# Patient Record
Sex: Male | Born: 1990 | Hispanic: Yes | Marital: Single | Smoking: Never smoker
Health system: Southern US, Community
[De-identification: ages and names within clinical notes are randomized; demographics above are authoritative.]

## PROBLEM LIST (undated history)

## (undated) ENCOUNTER — Ambulatory Visit (HOSPITAL_COMMUNITY): Admission: EM | Payer: Self-pay

## (undated) DIAGNOSIS — I10 Essential (primary) hypertension: Secondary | ICD-10-CM

---

## 2011-10-13 ENCOUNTER — Emergency Department (HOSPITAL_COMMUNITY)
Admission: EM | Admit: 2011-10-13 | Discharge: 2011-10-14 | Disposition: A | Discharge status: Home / Self Care | Payer: No Typology Code available for payment source | Attending: Emergency Medicine | Admitting: Emergency Medicine

## 2011-10-13 DIAGNOSIS — S0003XA Contusion of scalp, initial encounter: Secondary | ICD-10-CM | POA: Insufficient documentation

## 2011-10-13 DIAGNOSIS — S1093XA Contusion of unspecified part of neck, initial encounter: Secondary | ICD-10-CM | POA: Insufficient documentation

## 2011-10-13 DIAGNOSIS — R51 Headache: Secondary | ICD-10-CM | POA: Insufficient documentation

## 2011-10-13 DIAGNOSIS — S0990XA Unspecified injury of head, initial encounter: Secondary | ICD-10-CM | POA: Insufficient documentation

## 2011-10-14 ENCOUNTER — Emergency Department (HOSPITAL_COMMUNITY): Payer: No Typology Code available for payment source

## 2012-09-20 ENCOUNTER — Ambulatory Visit: Payer: Self-pay | Admitting: Family Medicine
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
## 2013-10-15 ENCOUNTER — Emergency Department (HOSPITAL_COMMUNITY): Payer: No Typology Code available for payment source

## 2013-10-15 ENCOUNTER — Emergency Department (HOSPITAL_COMMUNITY)
Admission: EM | Admit: 2013-10-15 | Discharge: 2013-10-15 | Disposition: A | Discharge status: Home / Self Care | Payer: No Typology Code available for payment source | Attending: Emergency Medicine | Admitting: Emergency Medicine

## 2013-10-15 ENCOUNTER — Encounter (HOSPITAL_COMMUNITY): Payer: Self-pay | Admitting: Emergency Medicine

## 2013-10-15 DIAGNOSIS — J069 Acute upper respiratory infection, unspecified: Secondary | ICD-10-CM | POA: Insufficient documentation

## 2013-10-15 DIAGNOSIS — R112 Nausea with vomiting, unspecified: Secondary | ICD-10-CM | POA: Insufficient documentation

## 2013-10-15 DIAGNOSIS — I10 Essential (primary) hypertension: Secondary | ICD-10-CM | POA: Insufficient documentation

## 2013-10-15 HISTORY — DX: Essential (primary) hypertension: I10

## 2013-10-15 LAB — BASIC METABOLIC PANEL
BUN: 10 mg/dL (ref 6–23)
CO2: 24 mEq/L (ref 19–32)
Calcium: 9.5 mg/dL (ref 8.4–10.5)
Creatinine, Ser: 0.67 mg/dL (ref 0.50–1.35)
GFR calc non Af Amer: >90 mL/min (ref >90)
Glucose, Bld: 100 mg/dL — ABNORMAL HIGH (ref 70–99)
Sodium: 138 mEq/L (ref 135–145)

## 2013-10-15 LAB — POCT I-STAT TROPONIN I: Troponin i, poc: 0.01 ng/mL (ref 0.00–0.08)

## 2013-10-15 LAB — CBC
HCT: 38.5 % — ABNORMAL LOW (ref 39.0–52.0)
Hemoglobin: 13.8 g/dL (ref 13.0–17.0)
MCH: 30.1 pg (ref 26.0–34.0)
MCHC: 35.8 g/dL (ref 30.0–36.0)
RDW: 12.7 % (ref 11.5–15.5)

## 2013-10-15 MED ORDER — ASPIRIN 325 MG PO TABS
325.0000 mg | ORAL_TABLET | ORAL | Status: AC
  Administered 2013-10-15: 325 mg via ORAL
  Filled 2013-10-15: qty 1

## 2013-10-15 MED ORDER — SODIUM CHLORIDE 0.9 % IV BOLUS (SEPSIS)
1000.0000 mL | Freq: Once | INTRAVENOUS | Status: AC
  Administered 2013-10-15: 1000 mL via INTRAVENOUS

## 2013-10-15 MED ORDER — ONDANSETRON HCL 4 MG/2ML IJ SOLN
4.0000 mg | Freq: Once | INTRAMUSCULAR | Status: AC
  Administered 2013-10-15: 4 mg via INTRAVENOUS
  Filled 2013-10-15: qty 2

## 2013-10-15 NOTE — ED Notes (Signed)
Pt started at 3am having ShOB, right side chest pain that goes to his back and hurts when he breathes and headache.  Pt vomited twice today and is currently nauseated.

## 2013-10-15 NOTE — ED Notes (Signed)
Pt reports had fever of 100 earlier today; no fever at this time.

## 2013-10-15 NOTE — Progress Notes (Signed)
Patient speaks minimal [place], spanish speaking.  Patient's girlfriend at bedside who speaks better [place] and can read English helped translate for patient.  Patient confirms that he does not have a pcp.  [facility] provided patient with a list of pcps who accept self pay patients, list of discounted pharmacies and website needymeds.org for medication assistance, list of financial assitance in the community such as [employer], crisis intervention information, and dental assistance for patients without insurance.  Patient thankful for resources.  No further needs at this time.

## 2013-10-15 NOTE — ED Provider Notes (Signed)
CSN: [id number]     Arrival date & time 10/15/13  1645 History   First MD Initiated Contact with Patient 10/15/13 1650     Chief Complaint  Patient presents with  . Shortness of Breath  . Emesis   (Consider location/radiation/quality/duration/timing/severity/associated sxs/prior Treatment) Patient is a 22 y.o. male presenting with shortness of breath. The history is provided by the patient and a friend. History limited by: spanish speaking with family interpreting.  Shortness of Breath Severity:  Mild Onset quality:  Gradual Duration:  1 day Relieved by:  None tried Associated symptoms: cough, fever and vomiting   Associated symptoms: no hemoptysis, no sputum production and no syncope   Cough:    Cough characteristics:  Non-productive   Severity:  Mild   Duration:  1 day Fever:    Duration:  1 day   Max temp PTA (F):  100   Temp source:  Subjective Vomiting:    Number of occurrences:  2 Risk factors: no hx of PE/DVT, no recent surgery and no tobacco use   Pt is a 22 year old male who presents today with a complaints of shortness of breath, cough and a dull headache since 0300 this morning. He reports that he has gradually been feeling bad throughout the day and has had a dry cough, no hemoptysis or sputum production. He denies any history of asthma, PE or DVT. He denies any cardiac history. He is accompanied by his wife and she reports that he has had subjective fever as high as 100. He has had a couple of episodes of vomiting and nausea today. No diarrhea, no known sick contacts and no recent travel.   Past Medical History  Diagnosis Date  . Hypertension    History reviewed. No pertinent past surgical history. No family history on file. History  Substance Use Topics  . Smoking status: Never Smoker   . Smokeless tobacco: Never Used  . Alcohol Use: No    Review of Systems  Constitutional: Positive for fever.  Respiratory: Positive for cough and shortness of breath.  Negative for hemoptysis and sputum production.   Cardiovascular: Negative for syncope.  Gastrointestinal: Positive for vomiting.    Allergies  Review of patient's allergies indicates no known allergies.  Home Medications   Current Outpatient Rx  Name  Route  Sig  Dispense  Refill  . acetaminophen (TYLENOL) 325 MG tablet   Oral   Take 650 mg by mouth every 6 (six) hours as needed for pain.         Marland Kitchen ibuprofen (ADVIL,MOTRIN) 200 MG tablet   Oral   Take 400 mg by mouth every 6 (six) hours as needed for pain.          BP 147/74  Pulse 106  Temp(Src) 98.8 F (37.1 C) (Oral)  Resp 14  SpO2 100% Physical Exam  Nursing note and vitals reviewed. Constitutional: He is oriented to person, place, and time. He appears well-developed and well-nourished. No distress.  HENT:  Head: Normocephalic and atraumatic.  Right Ear: External ear normal.  Left Ear: External ear normal.  Mouth/Throat: Oropharynx is clear and moist.  Eyes: Pupils are equal, round, and reactive to light.  Neck: Normal range of motion. Neck supple. No JVD present. No tracheal deviation present.  Good ROM of neck, no stiffness.   Cardiovascular: Normal rate, regular rhythm, normal heart sounds and intact distal pulses.  Exam reveals no gallop and no friction rub.   No murmur heard. Pulmonary/Chest: Breath  sounds normal. No respiratory distress. He has no wheezes. He exhibits no tenderness.  Abdominal: Soft. Bowel sounds are normal. He exhibits no distension. There is no tenderness. There is no rebound and no guarding.  Musculoskeletal: Normal range of motion.  Lymphadenopathy:    He has no cervical adenopathy.  Neurological: He is alert and oriented to person, place, and time. He has normal strength. No cranial nerve deficit or sensory deficit. Coordination normal.  Skin: Skin is warm and dry.  Psychiatric: He has a normal mood and affect. His behavior is normal. Thought content normal.    ED Course   Procedures (including critical care time) Labs Review Labs Reviewed  CBC  PRO B NATRIURETIC PEPTIDE  BASIC METABOLIC PANEL   Imaging Review No results found.  EKG Interpretation     Ventricular Rate:  105 PR Interval:  157 QRS Duration: 99 QT Interval:  334 QTC Calculation: 442 R Axis:   58 Text Interpretation:  Sinus tachycardia Prominent P waves, nondiagnostic ST elev, probable normal early repol pattern No old tracing to compare            MDM   1. URI (upper respiratory infection)    Supportive care and symptomatic treatment. Labs, negative. Chest x-ray; unremarkable. No signs of respiratory distress or wheezing. Troponin negative. EKG within normal limits. Minimal risk for PE or cardiac etiology. Headache better and overall improvement of symptoms after zofran and fluids. No meningeal signs.      Irish Elders, NP 10/15/13 1944

## 2013-10-15 NOTE — ED Provider Notes (Signed)
Medical screening examination/treatment/procedure(s) were performed by non-physician practitioner and as supervising physician I was immediately available for consultation/collaboration.  Makynna Manocchio L Kristalynn Coddington, MD 10/15/13 2148 

## 2013-10-18 LAB — CULTURE, GROUP A STREP

## 2017-03-28 ENCOUNTER — Ambulatory Visit (INDEPENDENT_AMBULATORY_CARE_PROVIDER_SITE_OTHER): Payer: Self-pay | Admitting: Emergency Medicine

## 2017-03-28 VITALS — BP 132/82 | HR 78 | Temp 98.2°F | Resp 16 | Ht 67.0 in | Wt 228.2 lb

## 2017-03-28 DIAGNOSIS — J0100 Acute maxillary sinusitis, unspecified: Secondary | ICD-10-CM

## 2017-03-28 DIAGNOSIS — R0981 Nasal congestion: Secondary | ICD-10-CM

## 2017-03-28 DIAGNOSIS — R531 Weakness: Secondary | ICD-10-CM

## 2017-03-28 DIAGNOSIS — J3489 Other specified disorders of nose and nasal sinuses: Secondary | ICD-10-CM

## 2017-03-28 MED ORDER — AMOXICILLIN-POT CLAVULANATE 875-125 MG PO TABS: 1.0000 | ORAL_TABLET | Freq: Two times a day (BID) | ORAL | 0 refills | Status: AC

## 2017-03-28 MED ORDER — CETIRIZINE-PSEUDOEPHEDRINE ER 5-120 MG PO TB12: 1.0000 | ORAL_TABLET | Freq: Two times a day (BID) | ORAL | 1 refills | Status: AC

## 2017-03-28 MED ORDER — PREDNISONE 20 MG PO TABS: 40.0000 mg | ORAL_TABLET | Freq: Every day | ORAL | 0 refills | Status: AC

## 2017-03-28 NOTE — Progress Notes (Signed)
Tony Osborne 26 y.o.   Chief Complaint  Patient presents with  . URI    sinus pressure/pain, body aches, cold chills, chest pressure, hurts to breathe, yellow phlegm/mucusx 1 week    HISTORY OF PRESENT ILLNESS: This is a 26 y.o. male complaining of sinus pressure and congestion x 1 week.  HPI   Prior to Admission medications   Medication Sig Start Date End Date Taking? Authorizing Provider  acetaminophen (TYLENOL) 325 MG tablet Take 650 mg by mouth every 6 (six) hours as needed for pain.    Historical Provider, MD  amoxicillin-clavulanate (AUGMENTIN) 875-125 MG tablet Take 1 tablet by mouth 2 (two) times daily. 03/28/17 04/04/17  Georgina QuintMiguel Jose Jaren Vanetten, MD  cetirizine-pseudoephedrine (ZYRTEC-D) 5-120 MG tablet Take 1 tablet by mouth 2 (two) times daily. 03/28/17 04/04/17  Georgina QuintMiguel Jose Christalyn Goertz, MD  ibuprofen (ADVIL,MOTRIN) 200 MG tablet Take 400 mg by mouth every 6 (six) hours as needed for pain.    Historical Provider, MD  predniSONE (DELTASONE) 20 MG tablet Take 2 tablets (40 mg total) by mouth daily with breakfast. 03/28/17 04/02/17  Georgina QuintMiguel Jose Chazlyn Cude, MD    No Known Allergies  There are no active problems to display for this patient.   Past Medical History:  Diagnosis Date  . Hypertension     History reviewed. No pertinent surgical history.  Social History   Social History  . Marital status: Single    Spouse name: N/A  . Number of children: N/A  . Years of education: N/A   Occupational History  . Not on file.   Social History Main Topics  . Smoking status: Never Smoker  . Smokeless tobacco: Never Used  . Alcohol use No  . Drug use: No  . Sexual activity: Not on file   Other Topics Concern  . Not on file   Social History Narrative  . No narrative on file    History reviewed. No pertinent family history.   Review of Systems  Constitutional: Positive for malaise/fatigue. Negative for chills and fever.  HENT: Positive for congestion, ear pain and  sinus pain. Negative for ear discharge, nosebleeds and sore throat.   Eyes: Negative for blurred vision, double vision, discharge and redness.  Respiratory: Positive for cough.   Cardiovascular: Negative.  Negative for chest pain, palpitations and leg swelling.  Gastrointestinal: Negative.  Negative for abdominal pain, diarrhea, nausea and vomiting.  Genitourinary: Negative.  Negative for dysuria and hematuria.  Musculoskeletal: Positive for myalgias. Negative for back pain and joint pain.  Skin: Negative for rash.  Neurological: Positive for dizziness, weakness and headaches. Negative for sensory change and focal weakness.  Endo/Heme/Allergies: Negative.   All other systems reviewed and are negative.  Vitals:   03/28/17 1532  BP: 132/82  Pulse: 78  Resp: 16  Temp: 98.2 F (36.8 C)     Physical Exam  Constitutional: He is oriented to person, place, and time. He appears well-developed and well-nourished.  HENT:  Head: Normocephalic and atraumatic.  Right Ear: External ear normal.  Left Ear: External ear normal.  Nose: Mucosal edema present. Right sinus exhibits maxillary sinus tenderness. Left sinus exhibits maxillary sinus tenderness.  Mouth/Throat: Oropharynx is clear and moist. No oropharyngeal exudate.  Eyes: Conjunctivae and EOM are normal. Pupils are equal, round, and reactive to light.  Neck: Normal range of motion. Neck supple. No JVD present. No thyromegaly present.  Cardiovascular: Normal rate, regular rhythm, normal heart sounds and intact distal pulses.   Pulmonary/Chest: Effort normal and  breath sounds normal.  Abdominal: Soft. Bowel sounds are normal. He exhibits no distension. There is no tenderness.  Musculoskeletal: Normal range of motion. He exhibits no edema or tenderness.  Lymphadenopathy:    He has no cervical adenopathy.  Neurological: He is alert and oriented to person, place, and time. No sensory deficit. He exhibits normal muscle tone.  Skin: Skin is  warm and dry. Capillary refill takes less than 2 seconds. No rash noted.  Psychiatric: He has a normal mood and affect. His behavior is normal.  Vitals reviewed.    ASSESSMENT & PLAN: Tony Osborne was seen today for uri.  Diagnoses and all orders for this visit:  Acute non-recurrent maxillary sinusitis  Sinus pressure  Sinus congestion  General weakness  Other orders -     amoxicillin-clavulanate (AUGMENTIN) 875-125 MG tablet; Take 1 tablet by mouth 2 (two) times daily. -     predniSONE (DELTASONE) 20 MG tablet; Take 2 tablets (40 mg total) by mouth daily with breakfast. -     cetirizine-pseudoephedrine (ZYRTEC-D) 5-120 MG tablet; Take 1 tablet by mouth 2 (two) times daily.    Patient Instructions       IF you received an x-ray today, you will receive an invoice from [facility]. Please contact [facility] at [phone] with questions or concerns regarding your invoice.   IF you received labwork today, you will receive an invoice from [facility]. Please contact [facility] at [phone] with questions or concerns regarding your invoice.   Our billing staff will not be able to assist you with questions regarding bills from these companies.  You will be contacted with the lab results as soon as they are available. The fastest way to get your results is to activate your My Chart account. Instructions are located on the last page of this paperwork. If you have not heard from [place] regarding the results in 2 weeks, please contact this office.      Sinusitis en los adultos (Sinusitis, Adult) La sinusitis es la inflamacin y Chief Technology Officer en los senos paranasales. Los senos paranasales son espacios vacos en los huesos alrededor del rostro. Estos se encuentran en estos lugares:  Alrededor de los ojos.  En la mitad de la frente.  Detrs de Architectural technologist.  En los pmulos. Los senos y las fosas nasales estn cubiertos de un lquido fibroso (mucosidad). Normalmente, la  mucosidad drena a travs de los senos. Cuando los tejidos nasales se inflaman o hinchan, la mucosidad puede quedar atrapada o bloqueada, por lo que el aire no puede fluir por los senos paranasales. Esto permite que se desarrollen bacterias, virus y hongos, lo que produce infecciones. CUIDADOS EN EL [employer], use o aplique los medicamentos de venta libre y los recetados solamente como se lo haya indicado el mdico. Estos pueden incluir aerosoles nasales.  Si le recetaron un antibitico, tmelo como se lo haya indicado el mdico. No deje de tomar los antibiticos aunque comience a Actor. Hidrtese y humidifique los ambientes  Beba suficiente agua para Pharmacologist la orina clara o de color amarillo plido.  Use un humidificador de vapor fro para mantener la humedad de su hogar por encima del 50%.  Inhale vapor durante 10a , de 3a 4veces al da o como se lo haya indicado el mdico. Puede hacer esto en el bao con el vapor del agua caliente de la ducha.  Trate de no exponerse al aire fro o seco. Reposo  Descanse todo lo que pueda.  Duerma  con la cabeza elevada.  Asegrese de dormir lo suficiente cada noche. Instrucciones generales  Pngase un pao caliente y hmedo en el rostro 3o 4veces al da, o como se lo haya indicado su mdico. Esto ayuda a Multimedia programmer.  Lave sus manos frecuentemente con agua y [place]. Use un desinfectante para manos si no dispone de [place] y [place].  No fume. Evite estar cerca de personas que fuman (fumador pasivo).  Concurra a todas las visitas de control como se lo haya indicado el mdico. Esto es importante. SOLICITE AYUDA SI:  Tiene fiebre.  Los sntomas empeoran.  Los sntomas no mejoran en el perodo de 10das. SOLICITE AYUDA DE INMEDIATO SI:  Siente un dolor de cabeza muy intenso.  No puede dejar de vomitar.  Tiene dolor o hinchazn en la zona del rostro o los ojos.  Tiene dificultad para ver.  Se  siente confundido.  Tiene el cuello rgido.  Tiene dificultad para respirar. Esta informacin no tiene Theme park manager el consejo del mdico. Asegrese de hacerle al mdico cualquier pregunta que tenga. Document Released: 09/10/2012 Document Revised: 04/09/2016 Document Reviewed: 10/12/2015 Elsevier Interactive Patient Education  2017 Elsevier Inc.     Edwina Barth, MD [facility]

## 2017-03-28 NOTE — Patient Instructions (Addendum)
   IF you received an x-ray today, you will receive an invoice from [facility]. Please contact  Radiology at [phone] with questions or concerns regarding your invoice.   IF you received labwork today, you will receive an invoice from [facility]. Please contact [facility] at [phone] with questions or concerns regarding your invoice.   Our billing staff will not be able to assist you with questions regarding bills from these companies.  You will be contacted with the lab results as soon as they are available. The fastest way to get your results is to activate your My Chart account. Instructions are located on the last page of this paperwork. If you have not heard from us regarding the results in 2 weeks, please contact this office.     Sinusitis en los adultos (Sinusitis, Adult) La sinusitis es la inflamacin y el dolor en los senos paranasales. Los senos paranasales son espacios vacos en los huesos alrededor del rostro. Estos se encuentran en estos lugares:  Alrededor de los ojos.  En la mitad de la frente.  Detrs de la nariz.  En los pmulos. Los senos y las fosas nasales estn cubiertos de un lquido fibroso (mucosidad). Normalmente, la mucosidad drena a travs de los senos. Cuando los tejidos nasales se inflaman o hinchan, la mucosidad puede quedar atrapada o bloqueada, por lo que el aire no puede fluir por los senos paranasales. Esto permite que se desarrollen bacterias, virus y hongos, lo que produce infecciones. CUIDADOS EN EL H[employer] Medicamentos  [employer]ome, use o aplique los medicamentos de venta libre y los recetados solamente como se lo haya indicado el mdico. Estos pueden incluir aerosoles nasales.  Si le recetaron un antibitico, tmelo como se lo haya indicado el mdico. No deje de tomar los antibiticos aunque comience a sentirse mejor. Hidrtese y humidifique los ambientes  Beba suficiente agua para mantener la orina clara o de color amarillo  plido.  Use un humidificador de vapor fro para mantener la humedad de su hogar por encima del 50%.  Inhale vapor durante 10a 15minutos, de 3a 4veces al da o como se lo haya indicado el mdico. Puede hacer esto en el bao con el vapor del agua caliente de la ducha.  Trate de no exponerse al aire fro o seco. Reposo  Descanse todo lo que pueda.  Duerma con la cabeza elevada.  Asegrese de dormir lo suficiente cada noche. Instrucciones generales  Pngase un pao caliente y hmedo en el rostro 3o 4veces al da, o como se lo haya indicado su mdico. Esto ayuda a calmar las molestias.  Lave sus manos frecuentemente con agua y jabn. Use un desinfectante para manos si no dispone de agua y jabn.  No fume. Evite estar cerca de personas que fuman (fumador pasivo).  Concurra a todas las visitas de control como se lo haya indicado el mdico. Esto es importante. SOLICITE AYUDA SI:  Tiene fiebre.  Los sntomas empeoran.  Los sntomas no mejoran en el perodo de 10das. SOLICITE AYUDA DE INMEDIATO SI:  Siente un dolor de cabeza muy intenso.  No puede dejar de vomitar.  Tiene dolor o hinchazn en la zona del rostro o los ojos.  Tiene dificultad para ver.  Se siente confundido.  Tiene el cuello rgido.  Tiene dificultad para respirar. Esta informacin no tiene como fin reemplazar el consejo del mdico. Asegrese de hacerle al mdico cualquier pregunta que tenga. Document Released: 09/10/2012 Document Revised: 04/09/2016 Document Reviewed: 10/12/2015 Elsevier Interactive Patient Education  2017   Elsevier Inc.  

## 2017-04-21 ENCOUNTER — Other Ambulatory Visit: Payer: Self-pay | Admitting: Emergency Medicine

## 2021-03-05 DIAGNOSIS — R1013 Epigastric pain: Secondary | ICD-10-CM | POA: Insufficient documentation

## 2021-03-05 DIAGNOSIS — I10 Essential (primary) hypertension: Secondary | ICD-10-CM | POA: Insufficient documentation

## 2021-03-06 ENCOUNTER — Encounter (HOSPITAL_COMMUNITY): Payer: Self-pay | Admitting: Emergency Medicine

## 2021-03-06 ENCOUNTER — Other Ambulatory Visit: Payer: Self-pay

## 2021-03-06 ENCOUNTER — Emergency Department (HOSPITAL_COMMUNITY)
Admission: EM | Admit: 2021-03-06 | Discharge: 2021-03-06 | Disposition: A | Discharge status: Home / Self Care | Payer: Self-pay | Attending: Emergency Medicine | Admitting: Emergency Medicine

## 2021-03-06 DIAGNOSIS — R1013 Epigastric pain: Secondary | ICD-10-CM

## 2021-03-06 LAB — COMPREHENSIVE METABOLIC PANEL
ALT: 35 U/L (ref 0–44)
AST: 23 U/L (ref 15–41)
Albumin: 4 g/dL (ref 3.5–5.0)
Alkaline Phosphatase: 76 U/L (ref 38–126)
Anion gap: 10 (ref 5–15)
BUN: 11 mg/dL (ref 6–20)
CO2: 20 mmol/L — ABNORMAL LOW (ref 22–32)
Calcium: 9.4 mg/dL (ref 8.9–10.3)
Chloride: 106 mmol/L (ref 98–111)
Creatinine, Ser: 0.63 mg/dL (ref 0.61–1.24)
GFR, Estimated: >60 mL/min (ref >60)
Glucose, Bld: 108 mg/dL — ABNORMAL HIGH (ref 70–99)
Potassium: 3.8 mmol/L (ref 3.5–5.1)
Sodium: 136 mmol/L (ref 135–145)
Total Bilirubin: 0.5 mg/dL (ref 0.3–1.2)
Total Protein: 6.8 g/dL (ref 6.5–8.1)

## 2021-03-06 LAB — URINALYSIS, ROUTINE W REFLEX MICROSCOPIC
Bilirubin Urine: NEGATIVE
Glucose, UA: NEGATIVE mg/dL
Hgb urine dipstick: NEGATIVE
Ketones, ur: NEGATIVE mg/dL
Leukocytes,Ua: NEGATIVE
Nitrite: NEGATIVE
Protein, ur: NEGATIVE mg/dL
Specific Gravity, Urine: 1.019 (ref 1.005–1.030)
pH: 5 (ref 5.0–8.0)

## 2021-03-06 LAB — CBC
HCT: 42.2 % (ref 39.0–52.0)
Hemoglobin: 14.4 g/dL (ref 13.0–17.0)
MCH: 29.6 pg (ref 26.0–34.0)
MCHC: 34.1 g/dL (ref 30.0–36.0)
MCV: 86.7 fL (ref 80.0–100.0)
Platelets: 266 10*3/uL (ref 150–400)
RBC: 4.87 MIL/uL (ref 4.22–5.81)
RDW: 12.8 % (ref 11.5–15.5)
WBC: 10.4 10*3/uL (ref 4.0–10.5)
nRBC: 0 % (ref 0.0–0.2)

## 2021-03-06 LAB — LIPASE, BLOOD: Lipase: 29 U/L (ref 11–51)

## 2021-03-06 MED ORDER — DICYCLOMINE HCL 10 MG/5ML PO SOLN
10.0000 mg | Freq: Once | ORAL | Status: AC
  Administered 2021-03-06: 10 mg via ORAL
  Filled 2021-03-06: qty 5

## 2021-03-06 MED ORDER — LIDOCAINE VISCOUS HCL 2 % MT SOLN
15.0000 mL | Freq: Once | OROMUCOSAL | Status: AC
  Administered 2021-03-06: 15 mL via ORAL
  Filled 2021-03-06: qty 15

## 2021-03-06 MED ORDER — PANTOPRAZOLE SODIUM 20 MG PO TBEC: 20.0000 mg | DELAYED_RELEASE_TABLET | Freq: Every day | ORAL | 0 refills | Status: DC

## 2021-03-06 MED ORDER — ALUM & MAG HYDROXIDE-SIMETH 200-200-20 MG/5ML PO SUSP
30.0000 mL | Freq: Once | ORAL | Status: AC
  Administered 2021-03-06: 30 mL via ORAL
  Filled 2021-03-06: qty 30

## 2021-03-06 MED ORDER — HYOSCYAMINE SULFATE 0.125 MG SL SUBL
0.2500 mg | SUBLINGUAL_TABLET | Freq: Once | SUBLINGUAL | Status: AC
  Administered 2021-03-06: 0.25 mg via SUBLINGUAL
  Filled 2021-03-06: qty 2

## 2021-03-06 NOTE — ED Triage Notes (Signed)
Patient reports upper abdominal pain onset today , no emesis or diarrhea , denies fever or chills .

## 2021-03-06 NOTE — ED Provider Notes (Signed)
[facility] EMERGENCY DEPARTMENT Provider Note   CSN: [id number] Arrival date & time: 03/05/21  2357     History Chief Complaint  Patient presents with  . Abdominal Pain    Tony Osborne is a 30 y.o. male with no chronic medical conditions who presents to the emergency department with a chief complaint of epigastric pain that began gradually around 10 AM.  The pain is burning and pressure-like and radiates through to the back.  It did seem to worsen after eating.  No other known aggravating or alleviating factors.  He reports associated increased burping and belching.  He did have loose stools 2 days ago, but this has since resolved.  He treated his symptoms with Gaviscon antacid medication with a little relief.  No history of similar pain.  No fever, chills, URI symptoms, nausea, vomiting, melena, hematochezia, constipation, GU symptoms, chest pain, shortness of breath.  His diet does consist of many spicy foods.  He recently started a new diet that is predominantly fish, chicken, and rice.  No dietary supplements.  He does not take NSAIDs.  No daily medications or recent prescriptions.  He denies alcohol use.  No tobacco use.  No illicit or recreational substance use.  The history is provided by the patient. A language interpreter was used ([place]).       Past Medical History:  Diagnosis Date  . Hypertension     Patient Active Problem List   Diagnosis Date Noted  . Acute non-recurrent maxillary sinusitis 03/28/2017  . Sinus pressure 03/28/2017  . Sinus congestion 03/28/2017  . General weakness 03/28/2017    History reviewed. No pertinent surgical history.     No family history on file.  Social History   Tobacco Use  . Smoking status: Never Smoker  . Smokeless tobacco: Never Used  Substance Use Topics  . Alcohol use: No  . Drug use: No    Home Medications Prior to Admission medications   Medication Sig Start Date End Date Taking?  Authorizing Provider  pantoprazole (PROTONIX) 20 MG tablet Take 1 tablet (20 mg total) by mouth daily for 14 days. 03/06/21 03/20/21 Yes McDonald, Mia A, PA-C    Allergies    Patient has no known allergies.  Review of Systems   Review of Systems  Constitutional: Negative for appetite change, chills and fever.  HENT: Negative for sore throat.   Respiratory: Negative for cough and shortness of breath.   Cardiovascular: Negative for chest pain and palpitations.  Gastrointestinal: Positive for abdominal pain. Negative for diarrhea, nausea and vomiting.       +burping, belching  Genitourinary: Negative for dysuria, flank pain, frequency, hematuria and urgency.  Musculoskeletal: Negative for back pain, gait problem, myalgias, neck pain and neck stiffness.  Skin: Negative for rash.  Allergic/Immunologic: Negative for immunocompromised state.  Neurological: Negative for seizures, syncope, weakness, numbness and headaches.  Psychiatric/Behavioral: Negative for confusion.    Physical Exam Updated Vital Signs BP 134/85   Pulse 82   Temp 99.2 F (37.3 C) (Oral)   Resp 16   Ht 5\' 6"  (1.676 m)   Wt 110 kg   SpO2 97%   BMI 39.14 kg/m   Physical Exam Vitals and nursing note reviewed.  Constitutional:      General: He is not in acute distress.    Appearance: He is well-developed. He is obese. He is not ill-appearing, toxic-appearing or diaphoretic.  HENT:     Head: Normocephalic.     Mouth/Throat:  Mouth: Mucous membranes are moist.  Eyes:     Conjunctiva/sclera: Conjunctivae normal.  Cardiovascular:     Rate and Rhythm: Normal rate and regular rhythm.     Pulses: Normal pulses.     Heart sounds: Normal heart sounds. No murmur heard. No friction rub. No gallop.   Pulmonary:     Effort: Pulmonary effort is normal. No respiratory distress.     Breath sounds: No stridor. No wheezing, rhonchi or rales.  Chest:     Chest wall: No tenderness.  Abdominal:     General: There is no  distension.     Palpations: Abdomen is soft. There is no mass.     Tenderness: There is abdominal tenderness. There is no right CVA tenderness, left CVA tenderness, guarding or rebound.     Hernia: No hernia is present.     Comments: Tender to palpation in the epigastric region.  No rebound or guarding.  Negative Murphy sign.  No CVA tenderness bilaterally.  No tenderness over McBurney's point.  Normoactive bowel sounds.  No organomegaly or masses.  Musculoskeletal:        General: No tenderness.     Cervical back: Neck supple.     Right lower leg: No edema.     Left lower leg: No edema.  Skin:    General: Skin is warm and dry.     Capillary Refill: Capillary refill takes less than 2 seconds.  Neurological:     Mental Status: He is alert.  Psychiatric:        Behavior: Behavior normal.     ED Results / Procedures / Treatments   Labs (all labs ordered are listed, but only abnormal results are displayed) Labs Reviewed  COMPREHENSIVE METABOLIC PANEL - Abnormal; Notable for the following components:      Result Value   CO2 20 (*)    Glucose, Bld 108 (*)    All other components within normal limits  LIPASE, BLOOD  CBC  URINALYSIS, ROUTINE W REFLEX MICROSCOPIC    EKG None  Radiology No results found.  Procedures Procedures   Medications Ordered in ED Medications  alum & mag hydroxide-simeth (MAALOX/MYLANTA) 200-200-20 MG/5ML suspension 30 mL (30 mLs Oral Given 03/06/21 0355)    And  lidocaine (XYLOCAINE) 2 % viscous mouth solution 15 mL (15 mLs Oral Given 03/06/21 0355)  dicyclomine (BENTYL) 10 MG/5ML solution 10 mg (10 mg Oral Given 03/06/21 0355)  hyoscyamine (LEVSIN SL) SL tablet 0.25 mg (0.25 mg Sublingual Given 03/06/21 0354)    ED Course  I have reviewed the triage vital signs and the nursing notes.  Pertinent labs & imaging results that were available during my care of the patient were reviewed by me and considered in my medical decision making (see chart for  details).    MDM Rules/Calculators/A&P                          30 year old male with no chronic medical conditions presenting with epigastric pain, onset today.  Vital signs are reassuring.  Differential diagnosis includes GERD, dyspepsia, peptic ulcer disease, pancreatitis, gastritis, cholecystitis, viral illness, and hiatal hernia.  I am much less suspicious for bowel obstruction, diverticulitis, GI bleed, pyelonephritis, obstructive uropathy, or appendicitis.  Physical exam with mild epigastric pain.  No peritoneal signs.  Physical exam is otherwise reassuring.  Labs have been reviewed and independently interpreted by me.  No metabolic derangements.  No leukocytosis.  Lipase is normal.  The patient has no risk factors for alcoholic gastritis or GI bleed.  Less likely peptic ulcer syndrome.  I am more concerned for dyspepsia.  Will give GI cocktail and reassess.  If improved, can discharge patient home with PPI and outpatient follow-up.  On re-evaluation, patient reports pain is significantly improved.  We will plan for discharge home with PPI.  Patient is hemodynamically stable no acute distress.  ER return precautions given.  Safe for discharge to home with outpatient follow-up as indicated.  Final Clinical Impression(s) / ED Diagnoses Final diagnoses:  Dyspepsia    Rx / DC Orders ED Discharge Orders         Ordered    pantoprazole (PROTONIX) 20 MG tablet  Daily        03/06/21 0331           Frederik Pear A, PA-C 03/06/21 0411    Mesner, Barbara Cower, MD 03/06/21 [phone]

## 2021-03-06 NOTE — Discharge Instructions (Addendum)
Gracias por permitirme atenderlo [employer] [facility].  Lo vieron hoy por dolor abdominal epigstrico. Su evaluacin fue ms consistente con dispepsia. Te trataron con un cctel GI.  En casa, tomar 1 comprimido de pantoprazol al da. Tambin puede continuar tomando Gaviscon o Pepcid como se indica en la etiqueta adems de pantoprazol si esto le ayuda con sus sntomas.  Los sntomas pueden ser provocados por la dieta. Trate de no comer al menos 2 horas antes de acostarse. Trate de minimizar las comidas picantes. Trate de llevar un diario de lo que parece provocar sus sntomas.  Puede llamar al nmero que figura en su documentacin de alta para establecer contacto con un proveedor de atencin primaria para el seguimiento.  Regrese al departamento de emergencias si presenta heces negras o con sangre, vmitos incontrolables, dolor abdominal intenso con una temperatura superior a 100.4 F, dificultad respiratoria u otros sntomas nuevos y preocupantes.  Thank you for allowing me to care for you today in the Emergency Department.   You were seen today for epigastric abdominal pain.  Your work-up was most consistent with dyspepsia.  You were treated with a GI cocktail.  At home, take 1 tablet of pantoprazole daily.  You can also continue to take Gaviscon or Pepcid as directed on the label in addition to pantoprazole if this helps your symptoms.  Symptoms may be brought on by diet.  Try not to eat at least 2 hours before you go to bed.  Try to minimize spicy foods.  Try to keep a diary of what seems to bring on your symptoms.  You can call the number on your discharge paperwork to get established with a primary care provider for follow-up.  Return to the emergency department if you develop black or bloody stools, uncontrollable vomiting, severe abdominal pain with a temperature of greater than 100.4 F, respiratory distress, or other new, concerning symptoms.

## 2021-03-06 NOTE — ED Notes (Signed)
Patient verbalizes understanding of discharge instructions. Prescriptions reviewed. Opportunity for questioning and answers were provided. Armband removed by staff, pt discharged from ED ambulatory. ° °

## 2022-06-22 ENCOUNTER — Encounter (HOSPITAL_COMMUNITY): Payer: Self-pay

## 2022-06-22 ENCOUNTER — Emergency Department (HOSPITAL_COMMUNITY)
Admission: EM | Admit: 2022-06-22 | Discharge: 2022-06-22 | Disposition: A | Discharge status: Home / Self Care | Payer: Commercial Managed Care - HMO | Attending: Emergency Medicine | Admitting: Emergency Medicine

## 2022-06-22 ENCOUNTER — Other Ambulatory Visit: Payer: Self-pay

## 2022-06-22 ENCOUNTER — Emergency Department (HOSPITAL_COMMUNITY): Payer: Commercial Managed Care - HMO

## 2022-06-22 DIAGNOSIS — Y9301 Activity, walking, marching and hiking: Secondary | ICD-10-CM | POA: Insufficient documentation

## 2022-06-22 DIAGNOSIS — S8992XA Unspecified injury of left lower leg, initial encounter: Secondary | ICD-10-CM | POA: Diagnosis present

## 2022-06-22 DIAGNOSIS — S80211A Abrasion, right knee, initial encounter: Secondary | ICD-10-CM | POA: Insufficient documentation

## 2022-06-22 DIAGNOSIS — S82145A Nondisplaced bicondylar fracture of left tibia, initial encounter for closed fracture: Secondary | ICD-10-CM | POA: Insufficient documentation

## 2022-06-22 DIAGNOSIS — W109XXA Fall (on) (from) unspecified stairs and steps, initial encounter: Secondary | ICD-10-CM | POA: Insufficient documentation

## 2022-06-22 DIAGNOSIS — S82142A Displaced bicondylar fracture of left tibia, initial encounter for closed fracture: Secondary | ICD-10-CM

## 2022-06-22 MED ORDER — IBUPROFEN 600 MG PO TABS: 600.0000 mg | ORAL_TABLET | Freq: Four times a day (QID) | ORAL | 0 refills | Status: DC | PRN

## 2022-06-22 MED ORDER — IBUPROFEN 200 MG PO TABS
600.0000 mg | ORAL_TABLET | Freq: Once | ORAL | Status: AC
  Administered 2022-06-22: 600 mg via ORAL
  Filled 2022-06-22: qty 1

## 2022-06-22 MED ORDER — ACETAMINOPHEN 500 MG PO TABS: 500.0000 mg | ORAL_TABLET | Freq: Four times a day (QID) | ORAL | 0 refills | Status: DC | PRN

## 2022-12-06 ENCOUNTER — Other Ambulatory Visit: Payer: Self-pay

## 2022-12-06 ENCOUNTER — Emergency Department (HOSPITAL_COMMUNITY)
Admission: EM | Admit: 2022-12-06 | Discharge: 2022-12-06 | Disposition: A | Discharge status: Home / Self Care | Payer: Commercial Managed Care - HMO | Attending: Emergency Medicine | Admitting: Emergency Medicine

## 2022-12-06 DIAGNOSIS — Z79899 Other long term (current) drug therapy: Secondary | ICD-10-CM | POA: Insufficient documentation

## 2022-12-06 DIAGNOSIS — R1010 Upper abdominal pain, unspecified: Secondary | ICD-10-CM | POA: Diagnosis present

## 2022-12-06 DIAGNOSIS — I10 Essential (primary) hypertension: Secondary | ICD-10-CM | POA: Insufficient documentation

## 2022-12-06 DIAGNOSIS — R1084 Generalized abdominal pain: Secondary | ICD-10-CM | POA: Insufficient documentation

## 2022-12-06 LAB — URINALYSIS, ROUTINE W REFLEX MICROSCOPIC
Bilirubin Urine: NEGATIVE
Glucose, UA: NEGATIVE mg/dL
Hgb urine dipstick: NEGATIVE
Ketones, ur: NEGATIVE mg/dL
Leukocytes,Ua: NEGATIVE
Nitrite: NEGATIVE
Protein, ur: NEGATIVE mg/dL
Specific Gravity, Urine: 1.009 (ref 1.005–1.030)
pH: 5 (ref 5.0–8.0)

## 2022-12-06 LAB — COMPREHENSIVE METABOLIC PANEL
ALT: 27 U/L (ref 0–44)
AST: 19 U/L (ref 15–41)
Albumin: 4.2 g/dL (ref 3.5–5.0)
Alkaline Phosphatase: 70 U/L (ref 38–126)
Anion gap: 9 (ref 5–15)
BUN: 10 mg/dL (ref 6–20)
CO2: 22 mmol/L (ref 22–32)
Calcium: 9.4 mg/dL (ref 8.9–10.3)
Chloride: 107 mmol/L (ref 98–111)
Creatinine, Ser: 0.75 mg/dL (ref 0.61–1.24)
GFR, Estimated: >60 mL/min (ref >60)
Glucose, Bld: 98 mg/dL (ref 70–99)
Potassium: 4.2 mmol/L (ref 3.5–5.1)
Sodium: 138 mmol/L (ref 135–145)
Total Bilirubin: 0.9 mg/dL (ref 0.3–1.2)
Total Protein: 7.4 g/dL (ref 6.5–8.1)

## 2022-12-06 LAB — CBC
HCT: 43.2 % (ref 39.0–52.0)
Hemoglobin: 14.9 g/dL (ref 13.0–17.0)
MCH: 29.9 pg (ref 26.0–34.0)
MCHC: 34.5 g/dL (ref 30.0–36.0)
MCV: 86.7 fL (ref 80.0–100.0)
Platelets: 266 10*3/uL (ref 150–400)
RBC: 4.98 MIL/uL (ref 4.22–5.81)
RDW: 13.3 % (ref 11.5–15.5)
WBC: 9.8 10*3/uL (ref 4.0–10.5)
nRBC: 0 % (ref 0.0–0.2)

## 2022-12-06 LAB — LIPASE, BLOOD: Lipase: 27 U/L (ref 11–51)

## 2022-12-06 MED ORDER — FAMOTIDINE 40 MG PO TABS: 40.0000 mg | ORAL_TABLET | Freq: Every day | ORAL | 0 refills | Status: DC

## 2022-12-06 MED ORDER — LIDOCAINE VISCOUS HCL 2 % MT SOLN
15.0000 mL | Freq: Once | OROMUCOSAL | Status: AC
  Administered 2022-12-06: 15 mL via ORAL
  Filled 2022-12-06: qty 15

## 2022-12-06 MED ORDER — LISINOPRIL 40 MG PO TABS: 40.0000 mg | ORAL_TABLET | Freq: Every day | ORAL | 0 refills | Status: DC

## 2022-12-06 MED ORDER — ALUM & MAG HYDROXIDE-SIMETH 200-200-20 MG/5ML PO SUSP
30.0000 mL | Freq: Once | ORAL | Status: AC
  Administered 2022-12-06: 30 mL via ORAL
  Filled 2022-12-06: qty 30

## 2022-12-06 MED ORDER — POLYETHYLENE GLYCOL 3350 17 GM/SCOOP PO POWD: 17.0000 g | Freq: Every day | ORAL | 0 refills | Status: DC

## 2022-12-06 NOTE — ED Provider Notes (Signed)
[facility] EMERGENCY DEPARTMENT Provider Note  CSN: [id number] Arrival date & time: 12/06/22 0730  Chief Complaint(s) Abdominal Pain  HPI Tony Osborne is a 31 y.o. male with history of hypertension presenting to the emergency department for stomach pain.  He reports that his pain is located on the left side.  He reports it is in the upper and lower abdomen.  He denies any other symptoms such as nausea, vomiting, fevers, chills, blood in his stool, hematemesis, hematochezia, black stools, lightheadedness, dizziness.  He does report some mild pain with defecation.  He was in the emergency department in 2022 with abdominal pain and reports that it was similar to the pain, and he reports that the pantoprazole he was discharged with did help his symptoms.  Spanish interpreter used   Past Medical History Past Medical History:  Diagnosis Date   Hypertension    Patient Active Problem List   Diagnosis Date Noted   Acute non-recurrent maxillary sinusitis 03/28/2017   Sinus pressure 03/28/2017   Sinus congestion 03/28/2017   General weakness 03/28/2017   Home Medication(s) Prior to Admission medications   Medication Sig Start Date End Date Taking? Authorizing Provider  famotidine (PEPCID) 40 MG tablet Take 1 tablet (40 mg total) by mouth daily. 12/06/22  Yes Cristie Hem, MD  lisinopril (ZESTRIL) 40 MG tablet Take 1 tablet (40 mg total) by mouth daily. 12/06/22  Yes Cristie Hem, MD  polyethylene glycol powder (GLYCOLAX/MIRALAX) 17 GM/SCOOP powder Take 17 g by mouth daily. 12/06/22  Yes Cristie Hem, MD  acetaminophen (TYLENOL) 500 MG tablet Take 1 tablet (500 mg total) by mouth every 6 (six) hours as needed. 06/22/22   Redwine, Madison A, PA-C  ibuprofen (ADVIL) 600 MG tablet Take 1 tablet (600 mg total) by mouth every 6 (six) hours as needed. 06/22/22   Redwine, Madison A, PA-C  pantoprazole (PROTONIX) 20 MG tablet Take 1 tablet (20 mg total)  by mouth daily for 14 days. 03/06/21 03/20/21  McDonald, Laymond Purser, PA-C                                                                                                                                    Past Surgical History No past surgical history on file. Family History No family history on file.  Social History Social History   Tobacco Use   Smoking status: Never   Smokeless tobacco: Never  Substance Use Topics   Alcohol use: No   Drug use: No   Allergies Patient has no known allergies.  Review of Systems Review of Systems  All other systems reviewed and are negative.   Physical Exam Vital Signs  I have reviewed the triage vital signs BP 130/83   Pulse 84   Temp 98.9 F (37.2 C) (Oral)   Resp 16   SpO2 98%  Physical Exam Vitals and nursing note reviewed.  Constitutional:  General: He is not in acute distress.    Appearance: Normal appearance.  HENT:     Mouth/Throat:     Mouth: Mucous membranes are moist.  Eyes:     Conjunctiva/sclera: Conjunctivae normal.  Cardiovascular:     Rate and Rhythm: Normal rate and regular rhythm.  Pulmonary:     Effort: Pulmonary effort is normal. No respiratory distress.     Breath sounds: Normal breath sounds.  Abdominal:     General: Abdomen is flat.     Palpations: Abdomen is soft.     Tenderness: There is no abdominal tenderness.  Musculoskeletal:     Right lower leg: No edema.     Left lower leg: No edema.  Skin:    General: Skin is warm and dry.     Capillary Refill: Capillary refill takes less than 2 seconds.  Neurological:     Mental Status: He is alert and oriented to person, place, and time. Mental status is at baseline.  Psychiatric:        Mood and Affect: Mood normal.        Behavior: Behavior normal.     ED Results and Treatments Labs (all labs ordered are listed, but only abnormal results are displayed) Labs Reviewed  URINALYSIS, ROUTINE W REFLEX MICROSCOPIC - Abnormal; Notable for the following  components:      Result Value   Color, Urine STRAW (*)    All other components within normal limits  LIPASE, BLOOD  COMPREHENSIVE METABOLIC PANEL  CBC                                                                                                                          Radiology No results found.  Pertinent labs & imaging results that were available during my care of the patient were reviewed by me and considered in my medical decision making (see MDM for details).  Medications Ordered in ED Medications  alum & mag hydroxide-simeth (MAALOX/MYLANTA) 200-200-20 MG/5ML suspension 30 mL (has no administration in time range)    And  lidocaine (XYLOCAINE) 2 % viscous mouth solution 15 mL (has no administration in time range)                                                                                                                                     Procedures Procedures  (including critical care time)  Medical  Decision Making / ED Course   MDM:  31 year old male presenting to the emergency department abdominal pain.  The patient is well-appearing, no focal abdominal tenderness on exam.  Suspect most likely cause of the pain is gastritis given similarity to previous episode that improved with pantoprazole.  Will discharge with Pepcid and Maalox in the emergency department.  Labs reassuring, doubt pancreatitis, cholecystitis, no focal abdominal tenderness to suggest diverticulitis, appendicitis.  No peritonitic signs to suggest perforation.  Patient reports some painful defecation, possibly mildly constipation and patient denies any rectal pain to suggest abscess, will try miralax.  Advised to return if his symptoms worsen or he develops any persistent symptoms, or any symptoms such as nausea or vomiting, fevers or chills, fainting, lightheadedness, chest pain, or any other concerning symptoms. Will discharge patient to home. All questions answered. Patient comfortable with plan of  discharge. Return precautions discussed with patient and specified on the after visit summary.  Patient also requested refill of his lisinopril.  He reports that he takes 40 mg which controls his blood pressure.  He has a pill bottle prescribed for 20 mg but he reports he takes 2 daily to control his blood pressure.  Will give 30-day supply.  Advised patient that he should follow-up with his primary doctor for further refills and also follow-up in around 2 weeks for lab check.      Additional history obtained: -External records from outside source obtained and reviewed including: Chart review including previous notes, labs, imaging, consultation notes including ED visit 3/22   Lab Tests: -I ordered, reviewed, and interpreted labs.   The pertinent results include:   Labs Reviewed  URINALYSIS, ROUTINE W REFLEX MICROSCOPIC - Abnormal; Notable for the following components:      Result Value   Color, Urine STRAW (*)    All other components within normal limits  LIPASE, BLOOD  COMPREHENSIVE METABOLIC PANEL  CBC    Notable for normal labs, normal lipase, no elevated WBC   Medicines ordered and prescription drug management: Meds ordered this encounter  Medications   AND Linked Order Group    alum & mag hydroxide-simeth (MAALOX/MYLANTA) 200-200-20 MG/5ML suspension 30 mL    lidocaine (XYLOCAINE) 2 % viscous mouth solution 15 mL   lisinopril (ZESTRIL) 40 MG tablet    Sig: Take 1 tablet (40 mg total) by mouth daily.    Dispense:  30 tablet    Refill:  0   famotidine (PEPCID) 40 MG tablet    Sig: Take 1 tablet (40 mg total) by mouth daily.    Dispense:  30 tablet    Refill:  0   polyethylene glycol powder (GLYCOLAX/MIRALAX) 17 GM/SCOOP powder    Sig: Take 17 g by mouth daily.    Dispense:  255 g    Refill:  0    -I have reviewed the patients home medicines and have made adjustments as needed   Social Determinants of Health:  Diagnosis or treatment significantly limited by social  determinants of health: obesity   Reevaluation: After the interventions noted above, I reevaluated the patient and found that they have improved  Co morbidities that complicate the patient evaluation  Past Medical History:  Diagnosis Date   Hypertension       Dispostion: Disposition decision including need for hospitalization was considered, and patient discharged from emergency department.    Final Clinical Impression(s) / ED Diagnoses Final diagnoses:  Generalized abdominal pain     This chart was dictated using voice  recognition software.  Despite best efforts to proofread,  errors can occur which can change the documentation meaning.    Lonell Grandchild, MD 12/06/22 1118

## 2022-12-06 NOTE — Discharge Instructions (Addendum)
Te evaluamos por tu dolor de [employer]. Es probable que sus sntomas se deban a Insurance underwriter revestimiento del estmago. Le hemos recetado un medicamento llamado Pepcid para que lo tome en lugar de este medicamento. Como tiene dolor abdominal bajo, tambin le recetaremos un medicamento llamado MiraLAX que puede ayudar con el estreimiento. Tome este medicamento una vez al da para ayudar con esto.  Regrese al departamento de emergencias si su dolor empeora o no mejora, o si presenta fiebre o escalofros, nuseas o vmitos, diarrea, sangre en las heces o cualquier otro sntoma preocupante.  We evaluated you for your stomach pain.  Your symptoms are likely due to inflammation in your stomach lining.  We have prescribed you a medicine called Pepcid to take for this medicine.  Since you are having lower abdominal pain, we will also prescribe you medicine called MiraLAX which can help with constipation.  Please take this medication once daily to help with this.  Please return to the emergency department if your pain worsens or does not improve, or you develop any fevers or chills, nausea or vomiting, diarrhea, blood in your stool, or any other concerning symptoms.

## 2022-12-06 NOTE — ED Triage Notes (Addendum)
Pt. Stated, Tony Osborne had stomach pain for a week. Denies any other symptom.

## 2023-01-02 ENCOUNTER — Emergency Department (HOSPITAL_COMMUNITY): Payer: Commercial Managed Care - HMO

## 2023-01-02 ENCOUNTER — Emergency Department (HOSPITAL_COMMUNITY)
Admission: EM | Admit: 2023-01-02 | Discharge: 2023-01-02 | Disposition: A | Discharge status: Home / Self Care | Payer: Commercial Managed Care - HMO | Attending: Emergency Medicine | Admitting: Emergency Medicine

## 2023-01-02 ENCOUNTER — Encounter (HOSPITAL_COMMUNITY): Payer: Self-pay

## 2023-01-02 ENCOUNTER — Other Ambulatory Visit: Payer: Self-pay

## 2023-01-02 DIAGNOSIS — I10 Essential (primary) hypertension: Secondary | ICD-10-CM | POA: Diagnosis present

## 2023-01-02 DIAGNOSIS — Z20822 Contact with and (suspected) exposure to covid-19: Secondary | ICD-10-CM | POA: Insufficient documentation

## 2023-01-02 DIAGNOSIS — Z79899 Other long term (current) drug therapy: Secondary | ICD-10-CM | POA: Insufficient documentation

## 2023-01-02 DIAGNOSIS — F419 Anxiety disorder, unspecified: Secondary | ICD-10-CM | POA: Diagnosis not present

## 2023-01-02 LAB — CBC WITH DIFFERENTIAL/PLATELET
Abs Immature Granulocytes: 0.03 10*3/uL (ref 0.00–0.07)
Basophils Absolute: 0.1 10*3/uL (ref 0.0–0.1)
Basophils Relative: 1 %
Eosinophils Absolute: 0.4 10*3/uL (ref 0.0–0.5)
Eosinophils Relative: 4 %
HCT: 43.9 % (ref 39.0–52.0)
Hemoglobin: 14.9 g/dL (ref 13.0–17.0)
Immature Granulocytes: 0 %
Lymphocytes Relative: 32 %
Lymphs Abs: 3.3 10*3/uL (ref 0.7–4.0)
MCH: 30.2 pg (ref 26.0–34.0)
MCHC: 33.9 g/dL (ref 30.0–36.0)
MCV: 88.9 fL (ref 80.0–100.0)
Monocytes Absolute: 0.7 10*3/uL (ref 0.1–1.0)
Monocytes Relative: 7 %
Neutro Abs: 5.7 10*3/uL (ref 1.7–7.7)
Neutrophils Relative %: 56 %
Platelets: 243 10*3/uL (ref 150–400)
RBC: 4.94 MIL/uL (ref 4.22–5.81)
RDW: 13.2 % (ref 11.5–15.5)
WBC: 10.2 10*3/uL (ref 4.0–10.5)
nRBC: 0 % (ref 0.0–0.2)

## 2023-01-02 LAB — COMPREHENSIVE METABOLIC PANEL
ALT: 25 U/L (ref 0–44)
AST: 21 U/L (ref 15–41)
Albumin: 4.2 g/dL (ref 3.5–5.0)
Alkaline Phosphatase: 76 U/L (ref 38–126)
Anion gap: 9 (ref 5–15)
BUN: 16 mg/dL (ref 6–20)
CO2: 23 mmol/L (ref 22–32)
Calcium: 8.9 mg/dL (ref 8.9–10.3)
Chloride: 105 mmol/L (ref 98–111)
Creatinine, Ser: 0.85 mg/dL (ref 0.61–1.24)
GFR, Estimated: >60 mL/min (ref >60)
Glucose, Bld: 117 mg/dL — ABNORMAL HIGH (ref 70–99)
Potassium: 3.6 mmol/L (ref 3.5–5.1)
Sodium: 137 mmol/L (ref 135–145)
Total Bilirubin: 0.5 mg/dL (ref 0.3–1.2)
Total Protein: 7.3 g/dL (ref 6.5–8.1)

## 2023-01-02 LAB — CBG MONITORING, ED: Glucose-Capillary: 112 mg/dL — ABNORMAL HIGH (ref 70–99)

## 2023-01-02 LAB — RESP PANEL BY RT-PCR (RSV, FLU A&B, COVID)  RVPGX2
Influenza A by PCR: NEGATIVE
Influenza B by PCR: NEGATIVE
Resp Syncytial Virus by PCR: NEGATIVE
SARS Coronavirus 2 by RT PCR: NEGATIVE

## 2023-01-02 MED ORDER — HYDROXYZINE HCL 25 MG PO TABS: 25.0000 mg | ORAL_TABLET | Freq: Three times a day (TID) | ORAL | 0 refills | Status: DC | PRN

## 2023-01-02 NOTE — ED Provider Triage Note (Signed)
  Emergency Medicine Provider Triage Evaluation Note  MRN:  [id number]  Arrival date & time: 01/02/23    Medically screening exam initiated at 3:05 AM.   CC:   Hypertension   HPI:  Tony Osborne is a 32 y.o. year-old male presents to the ED with chief complaint of high blood pressure.  States that he woke up having some SOB and generalized tingling sensation in his arms and legs.  Denies hx of the same.  Takes lisinopril, took a second dose PTA.  Denies fevers or chills.  History provided by patient. ROS:  -As included in HPI PE:   Vitals:   01/02/23 0301  BP: (!) 136/99  Pulse: (!) 104  Resp: 18  Temp: 98.2 F (36.8 C)  SpO2: 100%    Non-toxic appearing No respiratory distress CN 3-12 grossly intact MDM:  Based on signs and symptoms, anxiety is highest on my differential. I've ordered labs in triage to expedite lab/diagnostic workup.  Patient was informed that the remainder of the evaluation will be completed by another provider, this initial triage assessment does not replace that evaluation, and the importance of remaining in the ED until their evaluation is complete.    Montine Circle, PA-C 01/02/23 [phone]

## 2023-01-02 NOTE — Discharge Instructions (Signed)
Make an appointment to follow-up with your primary care doctor.  Return to the emergency if you have any worsening symptoms.

## 2023-01-02 NOTE — ED Provider Notes (Signed)
[facility] EMERGENCY DEPARTMENT Provider Note   CSN: [id number] Arrival date & time: 01/02/23  0252     History  Chief Complaint  Patient presents with   Hypertension    Tony Osborne is a 32 y.o. male.  Patient is a 32 year old male who presents with elevated blood pressure and anxiety.  He states he has a history of hypertension and takes lisinopril for this.  He said over the last month he has been having frequent anxiety attacks at night.  He has a feeling of impending doom and wakes up with rapid breathing and tingling around his mouth and in both of his hands.  He feels short of breath during this.  He had an episode last night and took his blood pressure and found it to be elevated in the 150s.  He took an extra dose of his lisinopril.  He feels back to baseline currently.  During the episodes, he has no weakness in his arms or his legs.  No speech deficits.  No double vision or loss of vision.  He says occasionally his vision appears blurry.  No associated chest pain.  He is currently asymptomatic.  He said he has had some anxiety for about the last month.  He denies any thoughts of self-harm or significant depression.  He does have a primary care provider which she sees.  He was going to call to make an appointment today but he had this episode and came here instead.  He has never been treated for anxiety in the past.  History was obtained through a Spanish video language line.       Home Medications Prior to Admission medications   Medication Sig Start Date End Date Taking? Authorizing Provider  acetaminophen (TYLENOL) 500 MG tablet Take 1 tablet (500 mg total) by mouth every 6 (six) hours as needed. 06/22/22   Redwine, Madison A, PA-C  famotidine (PEPCID) 40 MG tablet Take 1 tablet (40 mg total) by mouth daily. 12/06/22   Lonell Grandchild, MD  hydrOXYzine (ATARAX) 25 MG tablet Take 1 tablet (25 mg total) by mouth every 8 (eight) hours as needed  for anxiety. 01/02/23  Yes Rolan Bucco, MD  ibuprofen (ADVIL) 600 MG tablet Take 1 tablet (600 mg total) by mouth every 6 (six) hours as needed. 06/22/22   Redwine, Madison A, PA-C  lisinopril (ZESTRIL) 40 MG tablet Take 1 tablet (40 mg total) by mouth daily. 12/06/22   Lonell Grandchild, MD  pantoprazole (PROTONIX) 20 MG tablet Take 1 tablet (20 mg total) by mouth daily for 14 days. 03/06/21 03/20/21  McDonald, Mia A, PA-C  polyethylene glycol powder (GLYCOLAX/MIRALAX) 17 GM/SCOOP powder Take 17 g by mouth daily. 12/06/22   Lonell Grandchild, MD      Allergies    Patient has no known allergies.    Review of Systems   Review of Systems  Constitutional:  Negative for chills, diaphoresis, fatigue and fever.  HENT:  Negative for congestion, rhinorrhea and sneezing.   Eyes: Negative.   Respiratory:  Positive for shortness of breath. Negative for cough and chest tightness.   Cardiovascular:  Negative for chest pain and leg swelling.  Gastrointestinal:  Negative for abdominal pain, blood in stool, diarrhea, nausea and vomiting.  Genitourinary:  Negative for difficulty urinating, flank pain, frequency and hematuria.  Musculoskeletal:  Negative for arthralgias and back pain.  Skin:  Negative for rash.  Neurological:  Negative for dizziness, speech difficulty, weakness, numbness and  headaches.       Paresthesias  Psychiatric/Behavioral:  Negative for suicidal ideas. The patient is nervous/anxious.     Physical Exam Updated Vital Signs BP 116/75   Pulse 65   Temp 98.3 F (36.8 C)   Resp 18   SpO2 100%  Physical Exam Constitutional:      Appearance: He is well-developed.  HENT:     Head: Normocephalic and atraumatic.  Eyes:     Pupils: Pupils are equal, round, and reactive to light.  Cardiovascular:     Rate and Rhythm: Normal rate and regular rhythm.     Heart sounds: Normal heart sounds.  Pulmonary:     Effort: Pulmonary effort is normal. No respiratory distress.     Breath  sounds: Normal breath sounds. No wheezing or rales.  Chest:     Chest wall: No tenderness.  Abdominal:     General: Bowel sounds are normal.     Palpations: Abdomen is soft.     Tenderness: There is no abdominal tenderness. There is no guarding or rebound.  Musculoskeletal:        General: Normal range of motion.     Cervical back: Normal range of motion and neck supple.  Lymphadenopathy:     Cervical: No cervical adenopathy.  Skin:    General: Skin is warm and dry.     Findings: No rash.  Neurological:     Mental Status: He is alert and oriented to person, place, and time.     Comments: Motor 5/5 all extremities Sensation grossly intact to LT all extremities Finger to Nose intact, no pronator drift CN II-XII grossly intact Gait normal      ED Results / Procedures / Treatments   Labs (all labs ordered are listed, but only abnormal results are displayed) Labs Reviewed  COMPREHENSIVE METABOLIC PANEL - Abnormal; Notable for the following components:      Result Value   Glucose, Bld 117 (*)    All other components within normal limits  CBG MONITORING, ED - Abnormal; Notable for the following components:   Glucose-Capillary 112 (*)    All other components within normal limits  RESP PANEL BY RT-PCR (RSV, FLU A&B, COVID)  [id number]  CBC WITH DIFFERENTIAL/PLATELET    EKG None ED ECG REPORT   Date: 01/02/2023  Rate: 69  Rhythm: normal sinus rhythm  QRS Axis: normal  Intervals: normal  ST/T Wave abnormalities: normal  Conduction Disutrbances:none  Narrative Interpretation:   Old EKG Reviewed: unchanged  I have personally reviewed the EKG tracing and agree with the computerized printout as noted.   Radiology DG Chest 2 View  Result Date: 01/02/2023 CLINICAL DATA:  Shortness of breath EXAM: CHEST - 2 VIEW COMPARISON:  10/15/2013 FINDINGS: The heart size and mediastinal contours are within normal limits. Both lungs are clear. The visualized skeletal structures are  unremarkable. IMPRESSION: No active cardiopulmonary disease. Electronically Signed   By: Inez Catalina M.D.   On: 01/02/2023 03:28    Procedures Procedures    Medications Ordered in ED Medications - No data to display  ED Course/ Medical Decision Making/ A&P                           Medical Decision Making Problems Addressed: Anxiety: acute illness or injury with systemic symptoms Primary hypertension: acute illness or injury with systemic symptoms  Amount and/or Complexity of Data Reviewed External Data Reviewed: notes. Labs: ordered. Decision-making details documented  in ED Course. ECG/medicine tests: ordered and independent interpretation performed. Decision-making details documented in ED Course.  Risk Prescription drug management. Decision regarding hospitalization.   Patient is a 32 year old who presents with elevated blood pressure.  It seems to be associated with anxiety attacks.  He does not have any symptoms that sound more concerning for ACS.  No symptoms that sound more concerning for stroke or TIA.  His labs are reviewed and are nonconcerning.  Chest x-ray two-view showed no acute abnormalities.  This was interpreted by me and confirmed by the radiologist.  No ischemic changes or arrhythmias noted on EKG.  His blood pressure has normalized without treatment in the ED.  I discussed the findings with the patient.  He does admit to having a lot of anxiety mostly at night.  I suspect that this may be raising his blood pressure at night.  I discussed with him doing a trial of Atarax to see if that helps during his night episodes.  I advised him for now to take his normal dose of his blood pressure medicine and keep a log of his blood pressures.  I encouraged him to have close follow-up with his primary care doctor.  Return precautions were given.  Final Clinical Impression(s) / ED Diagnoses Final diagnoses:  Anxiety  Primary hypertension    Rx / DC Orders ED Discharge  Orders          Ordered    hydrOXYzine (ATARAX) 25 MG tablet  Every 8 hours PRN        01/02/23 1142              Malvin Johns, MD 01/02/23 1151

## 2023-01-02 NOTE — ED Triage Notes (Signed)
Spanish interpreter used for triage.   Sts he thinks his bp is high. Has had a flushed sensation, generalized feeling ill, shaky, and weak.   Denies pain, cough, fever or other sx.   Took additional antihypertensive tonight but says bp remains high. Unable to say what bp was pta.

## 2023-01-02 NOTE — ED Notes (Signed)
Cbg 112

## 2023-12-19 IMAGING — CT CT KNEE*L* W/O CM
3 series · 15 of 33 positions shown, 18 images · non-contrast
Comparison: Radiographs, same date.

CLINICAL DATA: Tibial plateau fracture.

EXAM:
CT OF THE LEFT KNEE WITHOUT CONTRAST
TECHNIQUE: Multidetector CT imaging of the left knee was performed according to
the standard protocol. Multiplanar CT image reconstructions were
also generated.
RADIATION DOSE REDUCTION: This exam was performed according to the
departmental dose-optimization program which includes automated
exposure control, adjustment of the mA and/or kV according to
patient size and/or use of iterative reconstruction technique.

[Series 4: extremity soft tissue · axial · 0.49mm/px · z∈[+358,+562]mm · 7 of 122 slices shown, 9 images]
[im 10/122  soft-tissue]
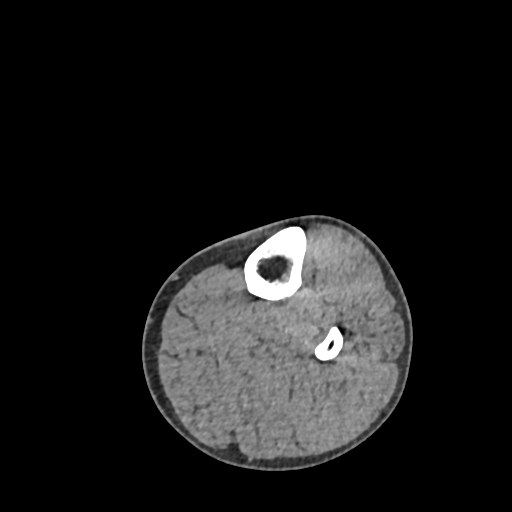
[im 10/122  bone]
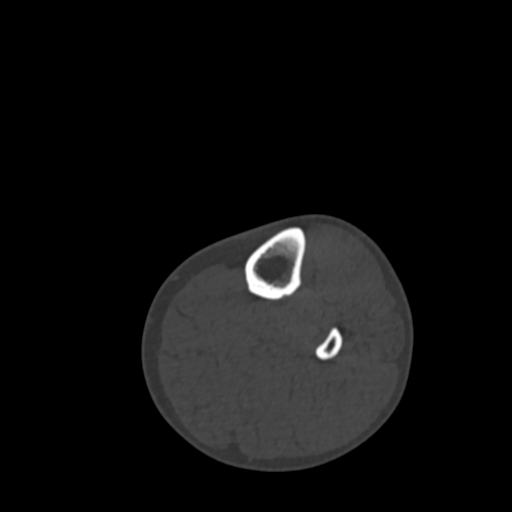
[im 28/122  bone]
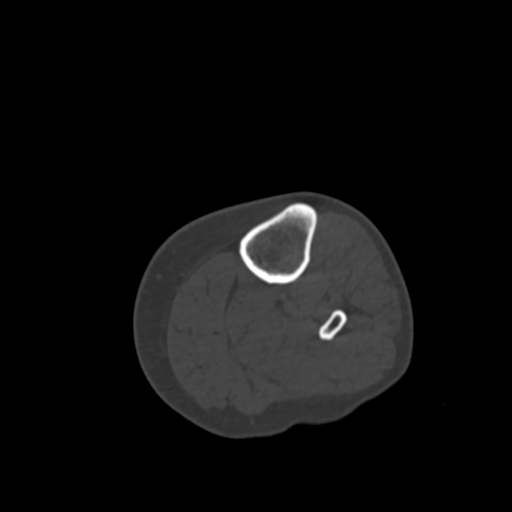
[im 47/122  bone]
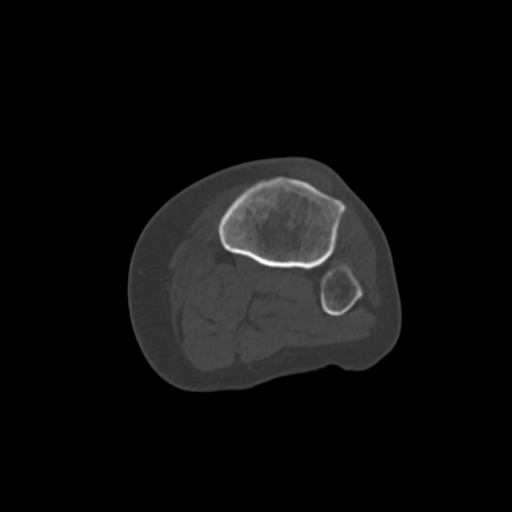
[im 66/122  bone]
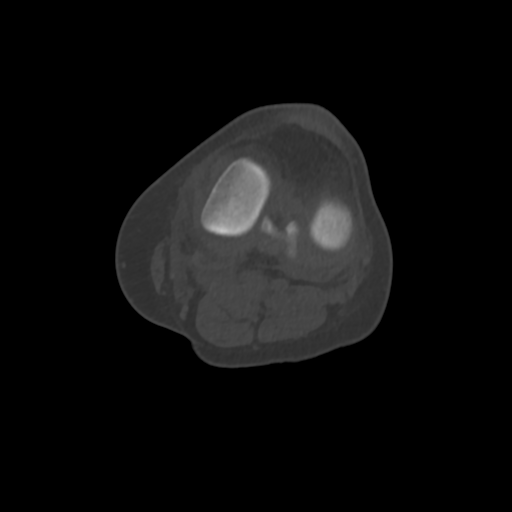
[im 75/122  soft-tissue]
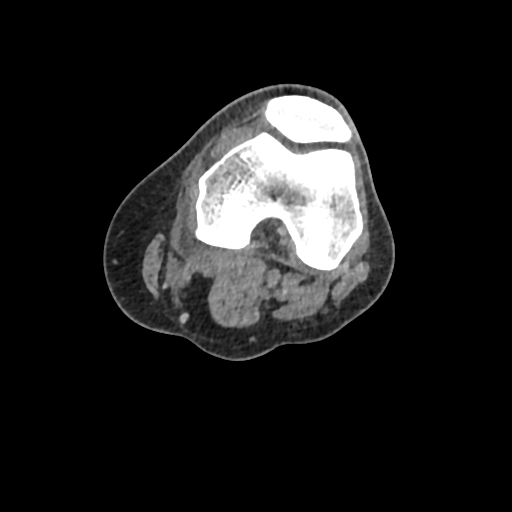
[im 75/122  bone]
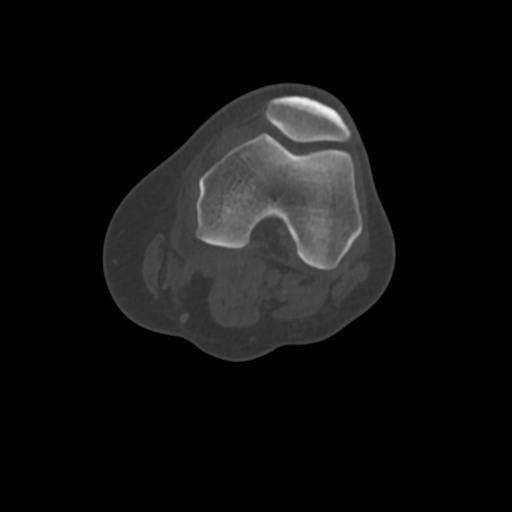
[im 94/122  bone]
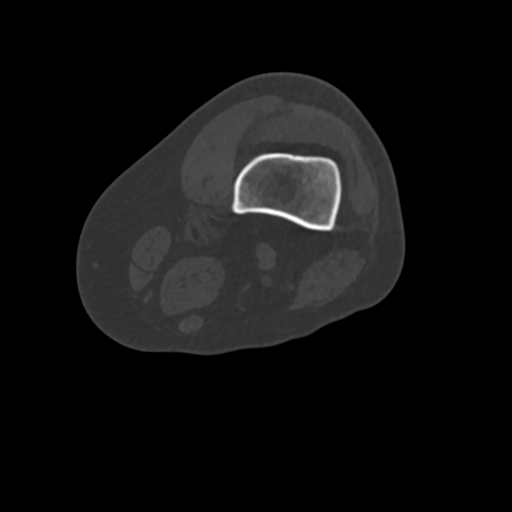
[im 112/122  bone]
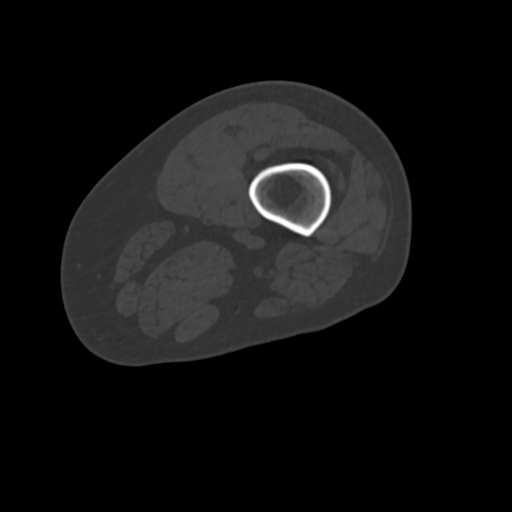

[Series 8: cor soft tissue · coronal · 0.35mm/px · 3 of 106 slices shown]
[im 22/106  bone]
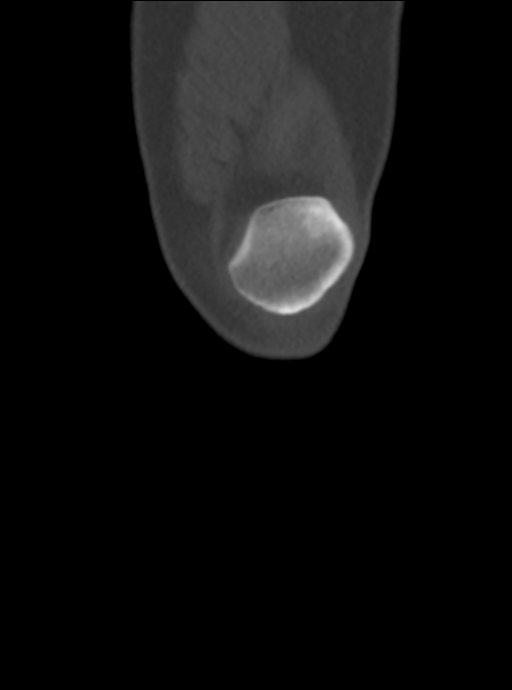
[im 43/106  bone]
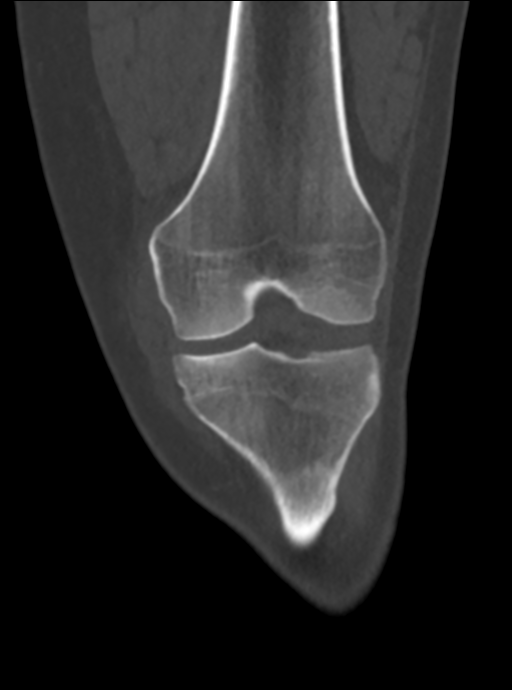
[im 64/106  bone]
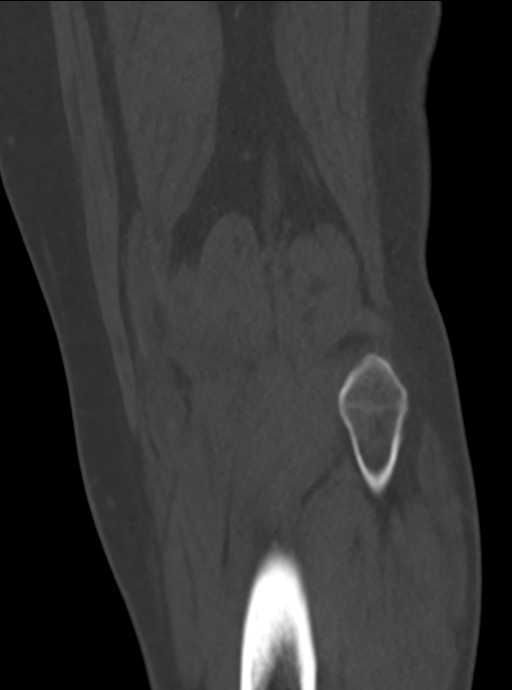

[Series 9: sag soft tissue · sagittal · 0.43mm/px · 5 of 78 slices shown, 6 images]
[im 26/78  bone]
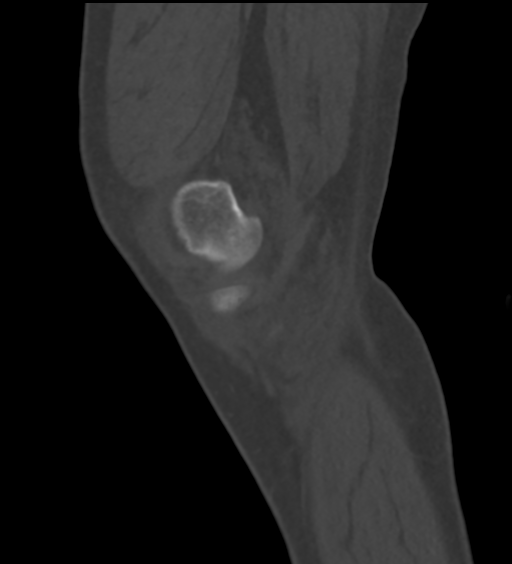
[im 33/78  bone]
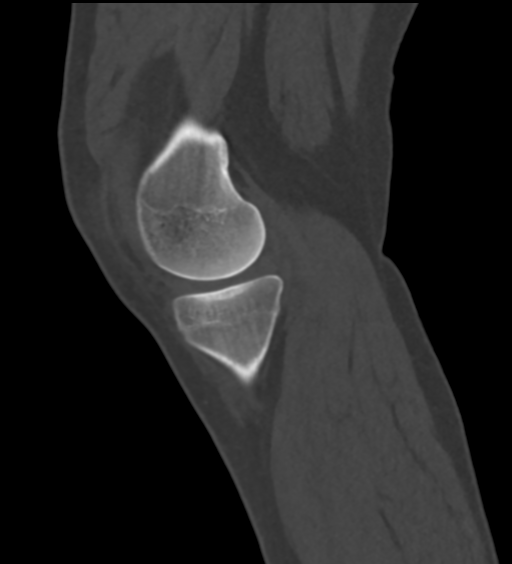
[im 39/78  soft-tissue]
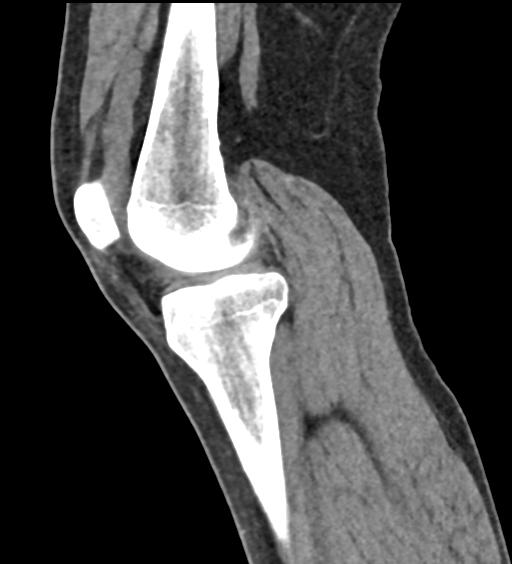
[im 39/78  bone]
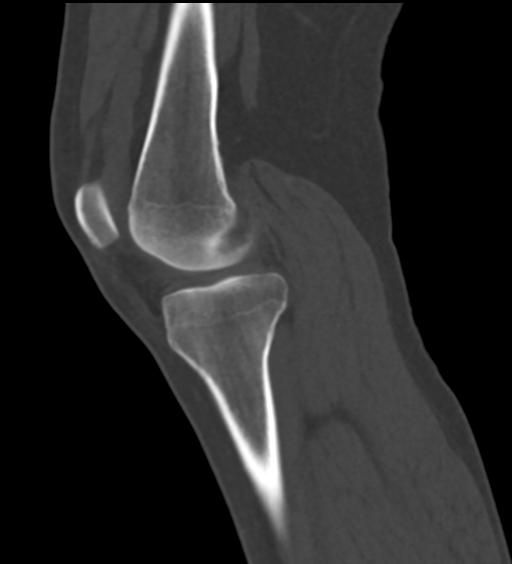
[im 45/78  bone]
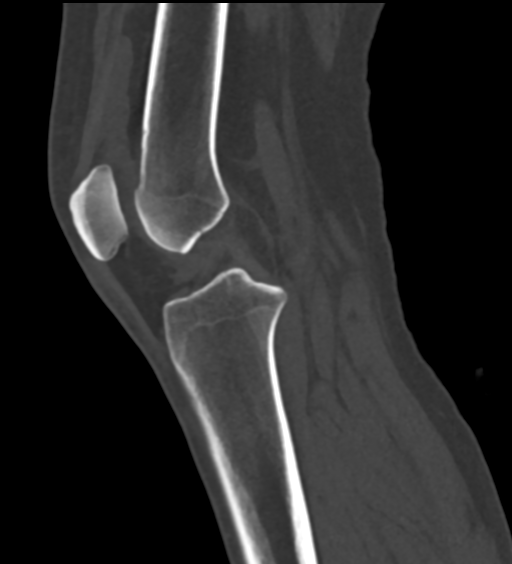
[im 52/78  bone]
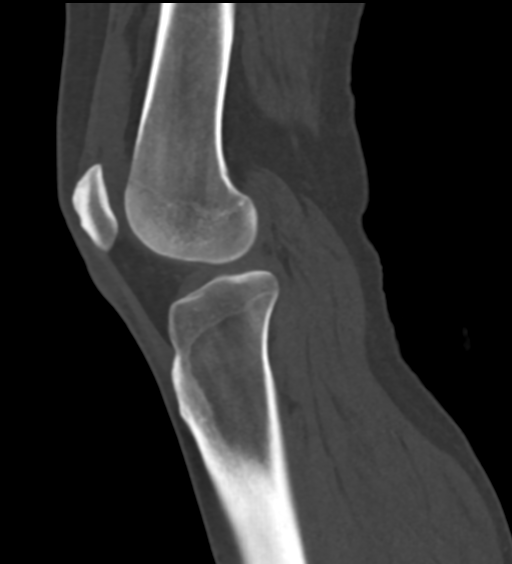

[15 of 33 positions shown; findings below may reference images not displayed]

FINDINGS: There is a minimally depressed lateral tibial plateau fracture with
very slight impaction and overlapping trabeculae. Small T shaped
nondisplaced intra-articular fracture with minimal separation of 2
mm.

The medial tibial plateau is intact. The medial and lateral tibial
spines are intact. The femur, fibula and patella are intact.

Associated lipohemarthrosis is noted.

Grossly by CT the cruciate and lateral collateral ligaments appear
intact. Suspect MCL injury. The quadriceps and patellar tendons are
intact.
IMPRESSION: 1. Minimally depressed lateral tibial plateau fracture.
2. Small T shaped nondisplaced intra-articular fracture with minimal
separation of 2 mm.
3. Associated lipohemarthrosis.
4. Suspect MCL injury.

## 2024-12-02 ENCOUNTER — Ambulatory Visit (HOSPITAL_COMMUNITY): Admission: EM | Admit: 2024-12-02 | Discharge: 2024-12-02 | Disposition: A | Discharge status: Home / Self Care

## 2024-12-02 ENCOUNTER — Encounter (HOSPITAL_COMMUNITY): Payer: Self-pay | Admitting: Emergency Medicine

## 2024-12-02 ENCOUNTER — Other Ambulatory Visit: Payer: Self-pay

## 2024-12-02 DIAGNOSIS — K2900 Acute gastritis without bleeding: Secondary | ICD-10-CM

## 2024-12-02 MED ORDER — LIDOCAINE VISCOUS HCL 2 % MT SOLN
15.0000 mL | Freq: Once | OROMUCOSAL | Status: AC
  Administered 2024-12-02: 15 mL via OROMUCOSAL

## 2024-12-02 MED ORDER — OMEPRAZOLE 40 MG PO CPDR: 40.0000 mg | DELAYED_RELEASE_CAPSULE | Freq: Every day | ORAL | 1 refills | Status: AC

## 2024-12-02 MED ORDER — LIDOCAINE VISCOUS HCL 2 % MT SOLN
OROMUCOSAL | Status: AC
  Filled 2024-12-02: qty 15

## 2024-12-02 MED ORDER — FAMOTIDINE 40 MG PO TABS: 40.0000 mg | ORAL_TABLET | Freq: Every day | ORAL | 1 refills | Status: AC

## 2024-12-02 MED ORDER — ALUM & MAG HYDROXIDE-SIMETH 200-200-20 MG/5ML PO SUSP
30.0000 mL | Freq: Once | ORAL | Status: AC
  Administered 2024-12-02: 30 mL via ORAL

## 2024-12-02 MED ORDER — ALUM & MAG HYDROXIDE-SIMETH 200-200-20 MG/5ML PO SUSP
ORAL | Status: AC
  Filled 2024-12-02: qty 30

## 2024-12-02 NOTE — ED Provider Notes (Signed)
 [facility]  Note:  This document was prepared using Conservation officer, historic buildings and may include unintentional dictation errors.  MRN: [id number] DOB: 10/17/1991  Subjective:   Tony Osborne is a 33 y.o. male presenting for left upper abdominal pain/epigastric pain x 4 to 5 days.  Patient denies any nausea or vomiting, dysuria, increased urinary frequency, abdominal pain, flank pain, fever.  Patient reports that symptoms become worse and feel like he burning inside his abdomen.  Patient reports currently he has some mild discomfort to the epigastric area which he describes as a feeling hot.  Patient denies taking any over-the-counter antacids or other medication to treat symptoms prior to arrival in urgent care.  Patient denies any past history or diagnosis of GERD or heartburn.  No current facility-administered medications for this encounter.  Current Outpatient Medications:    famotidine  (PEPCID ) 40 MG tablet, Take 1 tablet (40 mg total) by mouth daily., Disp: 30 tablet, Rfl: 1   omeprazole (PRILOSEC) 40 MG capsule, Take 1 capsule (40 mg total) by mouth daily., Disp: 90 capsule, Rfl: 1   No Known Allergies  Past Medical History:  Diagnosis Date   Hypertension      History reviewed. No pertinent surgical history.  No family history on file.  Social History   Tobacco Use   Smoking status: Never   Smokeless tobacco: Never  Substance Use Topics   Alcohol use: No   Drug use: No    ROS Refer to HPI for ROS details.  Objective:    Vitals: BP 121/81 (BP Location: Left Arm)   Pulse 69   Temp 97.7 F (36.5 C) (Oral)   Resp 17   SpO2 97%   Physical Exam Vitals and nursing note reviewed.  Constitutional:      General: He is not in acute distress.    Appearance: He is well-developed. He is not ill-appearing or toxic-appearing.  HENT:     Head: Normocephalic.     Nose: Nose normal.     Mouth/Throat:     Mouth: Mucous membranes are  moist.     Pharynx: Oropharynx is clear.  Cardiovascular:     Rate and Rhythm: Normal rate.  Pulmonary:     Effort: Pulmonary effort is normal. No respiratory distress.     Breath sounds: No stridor. No wheezing.  Abdominal:     General: There is no distension.     Tenderness: There is abdominal tenderness (Left upper quadrant abdomen/epigastric region, increased pain with palpation and after meals.). There is no right CVA tenderness, left CVA tenderness, guarding or rebound.  Skin:    General: Skin is warm and dry.  Neurological:     General: No focal deficit present.     Mental Status: He is alert and oriented to person, place, and time.  Psychiatric:        Mood and Affect: Mood normal.        Behavior: Behavior normal.     Procedures  No results found for this or any previous visit (from the past 24 hours).  Assessment and Plan :     Discharge Instructions       1. Acute gastritis without hemorrhage, unspecified gastritis type (Primary) - alum & mag hydroxide-simeth (MAALOX/MYLANTA) 200-200-20 MG/5ML suspension 30 mL and lidocaine  (XYLOCAINE ) 2 % viscous mouth solution 15 mL given in UC for acute gastritis. - famotidine  (PEPCID ) 40 MG tablet; Take 1 tablet (40 mg total) by mouth daily.  Dispense: 30 tablet; Refill:  1 - omeprazole (PRILOSEC) 40 MG capsule; Take 1 capsule (40 mg total) by mouth daily.  Dispense: 90 capsule; Refill: 1 - H. pylori Breath Collection; Future, order placed to be completed outpatient lab to rule out H. pylori infection as cause of gastritis. - Follow-up with primary care provider as scheduled for ongoing management of gastroesophageal reflux and possible H. pylori infection.  -Continue to monitor symptoms for any change in severity if there is any escalation of current symptoms or development of new symptoms follow-up in ER for further evaluation and management.      Cletus Mehlhoff B Karime Scheuermann   Reannah Totten, [place] B, [place] 12/02/24 1042

## 2024-12-02 NOTE — ED Triage Notes (Signed)
 Male with patient helping translates  Pt c/o left side abd pains for 5 days. Denies n/v, urinary or bowel problems. Reports after eats feels burning inside and pain is worse. Denies taking measures to try to help with pain.

## 2024-12-02 NOTE — Discharge Instructions (Signed)
  1. Acute gastritis without hemorrhage, unspecified gastritis type (Primary) - alum & mag hydroxide-simeth (MAALOX/MYLANTA) 200-200-20 MG/5ML suspension 30 mL and lidocaine  (XYLOCAINE ) 2 % viscous mouth solution 15 mL given in UC for acute gastritis. - famotidine  (PEPCID ) 40 MG tablet; Take 1 tablet (40 mg total) by mouth daily.  Dispense: 30 tablet; Refill: 1 - omeprazole (PRILOSEC) 40 MG capsule; Take 1 capsule (40 mg total) by mouth daily.  Dispense: 90 capsule; Refill: 1 - H. pylori Breath Collection; Future, order placed to be completed outpatient lab to rule out H. pylori infection as cause of gastritis. - Follow-up with primary care provider as scheduled for ongoing management of gastroesophageal reflux and possible H. pylori infection.  -Continue to monitor symptoms for any change in severity if there is any escalation of current symptoms or development of new symptoms follow-up in ER for further evaluation and management.

## 2024-12-04 ENCOUNTER — Ambulatory Visit (HOSPITAL_COMMUNITY): Payer: Self-pay

## 2024-12-04 DIAGNOSIS — A048 Other specified bacterial intestinal infections: Secondary | ICD-10-CM

## 2024-12-04 LAB — H. PYLORI BREATH TEST: H pylori Breath Test: POSITIVE — AB

## 2024-12-04 MED ORDER — LANSOPRAZOLE 30 MG PO CPDR: 30.0000 mg | DELAYED_RELEASE_CAPSULE | Freq: Two times a day (BID) | ORAL | 0 refills | Status: AC

## 2024-12-04 MED ORDER — METRONIDAZOLE 500 MG PO TABS: 500.0000 mg | ORAL_TABLET | Freq: Four times a day (QID) | ORAL | 0 refills | Status: AC

## 2024-12-04 MED ORDER — BISMUTH SUBSALICYLATE 525 MG PO TABS: 1.0000 | ORAL_TABLET | Freq: Four times a day (QID) | ORAL | 0 refills | Status: AC

## 2024-12-04 MED ORDER — TETRACYCLINE HCL 500 MG PO CAPS: 500.0000 mg | ORAL_CAPSULE | Freq: Four times a day (QID) | ORAL | 0 refills | Status: AC

## 2024-12-04 NOTE — Telephone Encounter (Signed)
 H. pylori test was positive.  H. pylori is the main cause of peptic ulcer disease and is also known to cause gastric cancer.  It causes all the symptoms that he described to the provider when he was seen in urgent care.  It is very difficult to treat H. pylori so instructions must be followed carefully.  Patient has not had H. pylori in the past.  Recommend bismuth  quadruple therapy for treatment as follows:  Discontinue omeprazole  for 14 days while being treated.  Begin Prevacid  1 capsule twice daily for 14 days.  Begin bismuth  subsalicylate 1 tablet 4 times daily for 14 days.  Begin tetracycline  500 mg 4 times daily for 14 days.  Begin metronidazole  500 mg 4 times daily for 14 days.  All of these medications are not only safe together, they are strongly recommended for best treatment against this difficult to treat infection.  Please advise patient to discontinue his medications at any point during his treatment.  If he is not tolerating any of his medications, he should immediately reach out to either his primary care provider or to urgent care to discuss alternative options.  It is very important that he has repeat H. pylori testing to confirm that the infection has been eradicated.  This should be performed 6 weeks after he begins treatment.    This can be ordered by his primary care provider or if he would like to return to urgent care to have a second urea breath test ordered at the outpatient lab he is welcome to do that as well.

## 2024-12-17 ENCOUNTER — Ambulatory Visit: Payer: Self-pay | Admitting: Family Medicine

## 2025-01-08 ENCOUNTER — Ambulatory Visit: Payer: Self-pay | Admitting: Family Medicine
